# Patient Record
Sex: Male | Born: 1995 | Race: White | Hispanic: No | Marital: Single | Smoking: Never smoker
Health system: Southern US, Community
[De-identification: ages and names within clinical notes are randomized; demographics above are authoritative.]

## PROBLEM LIST (undated history)

## (undated) ENCOUNTER — Emergency Department (HOSPITAL_COMMUNITY): Admission: EM | Payer: Self-pay | Source: Home / Self Care

## (undated) DIAGNOSIS — R569 Unspecified convulsions: Secondary | ICD-10-CM

## (undated) DIAGNOSIS — S3992XA Unspecified injury of lower back, initial encounter: Secondary | ICD-10-CM

## (undated) HISTORY — PX: OTHER SURGICAL HISTORY: SHX169

## (undated) HISTORY — DX: Unspecified convulsions: R56.9

---
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
## 2018-01-27 ENCOUNTER — Emergency Department (HOSPITAL_COMMUNITY)
Admission: EM | Admit: 2018-01-27 | Discharge: 2018-01-27 | Disposition: A | Discharge status: Home / Self Care | Payer: Self-pay | Attending: Emergency Medicine | Admitting: Emergency Medicine

## 2018-01-27 ENCOUNTER — Encounter (HOSPITAL_COMMUNITY): Payer: Self-pay | Admitting: *Deleted

## 2018-01-27 DIAGNOSIS — R569 Unspecified convulsions: Secondary | ICD-10-CM

## 2018-01-27 DIAGNOSIS — Z79899 Other long term (current) drug therapy: Secondary | ICD-10-CM | POA: Insufficient documentation

## 2018-01-27 DIAGNOSIS — G40909 Epilepsy, unspecified, not intractable, without status epilepticus: Secondary | ICD-10-CM | POA: Insufficient documentation

## 2018-01-27 LAB — URINALYSIS, ROUTINE W REFLEX MICROSCOPIC
Bilirubin Urine: NEGATIVE
GLUCOSE, UA: NEGATIVE mg/dL
Hgb urine dipstick: NEGATIVE
Ketones, ur: NEGATIVE mg/dL
LEUKOCYTES UA: NEGATIVE
NITRITE: NEGATIVE
Protein, ur: NEGATIVE mg/dL
Specific Gravity, Urine: 1.021 (ref 1.005–1.030)
pH: 5 (ref 5.0–8.0)

## 2018-01-27 LAB — RAPID URINE DRUG SCREEN, HOSP PERFORMED
Amphetamines: NOT DETECTED
Barbiturates: NOT DETECTED
Benzodiazepines: NOT DETECTED
COCAINE: NOT DETECTED
OPIATES: NOT DETECTED
Tetrahydrocannabinol: POSITIVE — AB

## 2018-01-27 LAB — CBC WITH DIFFERENTIAL/PLATELET
Abs Immature Granulocytes: 0.07 10*3/uL (ref 0.00–0.07)
Basophils Absolute: 0.1 10*3/uL (ref 0.0–0.1)
Basophils Relative: 1 %
EOS ABS: 0.1 10*3/uL (ref 0.0–0.5)
EOS PCT: 1 %
HCT: 51.4 % (ref 39.0–52.0)
Hemoglobin: 16.3 g/dL (ref 13.0–17.0)
Immature Granulocytes: 1 %
Lymphocytes Relative: 33 %
Lymphs Abs: 2.3 10*3/uL (ref 0.7–4.0)
MCH: 28.9 pg (ref 26.0–34.0)
MCHC: 31.7 g/dL (ref 30.0–36.0)
MCV: 91.1 fL (ref 80.0–100.0)
Monocytes Absolute: 0.6 10*3/uL (ref 0.1–1.0)
Monocytes Relative: 8 %
NRBC: 0 % (ref 0.0–0.2)
Neutro Abs: 3.8 10*3/uL (ref 1.7–7.7)
Neutrophils Relative %: 56 %
Platelets: 239 10*3/uL (ref 150–400)
RBC: 5.64 MIL/uL (ref 4.22–5.81)
RDW: 11.8 % (ref 11.5–15.5)
WBC: 6.9 10*3/uL (ref 4.0–10.5)

## 2018-01-27 LAB — BASIC METABOLIC PANEL
Anion gap: 10 (ref 5–15)
BUN: 14 mg/dL (ref 6–20)
CALCIUM: 9.5 mg/dL (ref 8.9–10.3)
CO2: 24 mmol/L (ref 22–32)
Chloride: 106 mmol/L (ref 98–111)
Creatinine, Ser: 0.91 mg/dL (ref 0.61–1.24)
GFR calc non Af Amer: >60 mL/min (ref >60)
Glucose, Bld: 97 mg/dL (ref 70–99)
Potassium: 3.8 mmol/L (ref 3.5–5.1)
SODIUM: 140 mmol/L (ref 135–145)

## 2018-01-27 MED ORDER — ACETAMINOPHEN 325 MG PO TABS
650.0000 mg | ORAL_TABLET | Freq: Once | ORAL | Status: AC
  Administered 2018-01-27: 650 mg via ORAL
  Filled 2018-01-27: qty 2

## 2018-01-27 MED ORDER — LEVETIRACETAM 500 MG PO TABS: 500.0000 mg | ORAL_TABLET | Freq: Two times a day (BID) | ORAL | 2 refills | Status: DC

## 2018-01-27 MED ORDER — LEVETIRACETAM IN NACL 1000 MG/100ML IV SOLN
1000.0000 mg | Freq: Once | INTRAVENOUS | Status: AC
  Administered 2018-01-27: 1000 mg via INTRAVENOUS
  Filled 2018-01-27: qty 100

## 2018-01-27 MED ORDER — SODIUM CHLORIDE 0.9 % IV SOLN
INTRAVENOUS | Status: DC
  Administered 2018-01-27: 08:00:00 via INTRAVENOUS

## 2018-01-27 NOTE — ED Provider Notes (Addendum)
[facility] EMERGENCY DEPARTMENT Provider Note   CSN: [id number] Arrival date & time: 01/27/18  4098     History   Chief Complaint Chief Complaint  Patient presents with  . Seizures    HPI Patrick Mckay is a 22 y.o. male.  HPI   Patient transferred by EMS for evaluation of seizure.  He was at home with roommates, they were preparing breakfast, when roommates noticed that he was "choking."  After that they felt that he was unresponsive so called EMS.  Specific shaking was not described.  EMS felt that the patient was postictal, then recovered his mentation after short period of time.  Currently in the ED, patient denies headache, neck pain, back pain, nausea, vomiting, weakness or dizziness.  He is not taking antiepileptics although has been prescribed Keppra in the past.  He recalls taking it for only a few months.  There are no other known modifying factors.  Past Medical History:  Diagnosis Date  . Back injury    lower back, pedicle fracture 2011  . Seizures [facility])     Patient Active Problem List   Diagnosis Date Noted  . Seizures (HCC) 03/21/2014    Past Surgical History:  Procedure Laterality Date  . no surgical history          Home Medications    Prior to Admission medications   Medication Sig Start Date End Date Taking? Authorizing Provider  divalproex (DEPAKOTE ER) 500 MG 24 hr tablet TAKE 1 TABLET BY MOUTH AT NIGHT FOR 1 WEEK, THEN TAKE 2 TABLETS NIGHTLY THEREAFTER. NEED OFFICE VISIT 08/26/14   York Spaniel, MD  ibuprofen (ADVIL,MOTRIN) 200 MG tablet Take 600 mg by mouth every 6 (six) hours as needed for moderate pain.    [provider]  levETIRAcetam (KEPPRA) 500 MG tablet Take 1 tablet (500 mg total) by mouth 2 (two) times daily. 01/27/18   Mancel Bale, MD  oxyCODONE-acetaminophen (PERCOCET/ROXICET) 5-325 MG per tablet Take 1-2 tablets by mouth every 6 (six) hours as needed for moderate pain or severe pain. 04/08/14    Antony Madura, PA-C    Family History Family History  Problem Relation Age of Onset  . Seizures Other     Social History Social History   Tobacco Use  . Smoking status: Never Smoker  Substance Use Topics  . Alcohol use: No  . Drug use: Yes    Types: Marijuana     Allergies   Keppra [levetiracetam] and Tramadol   Review of Systems Review of Systems  All other systems reviewed and are negative.    Physical Exam Updated Vital Signs BP 117/77   Pulse 63   Temp 97.9 F (36.6 C) (Oral)   Resp 18   Ht 6\' 1"  (1.854 m)   Wt 77.1 kg   SpO2 98%   BMI 22.43 kg/m   Physical Exam  Constitutional: He is oriented to person, place, and time. He appears well-developed and well-nourished. No distress.  HENT:  Head: Normocephalic.  Right Ear: External ear normal.  Left Ear: External ear normal.  Abrasion left anterior tongue consistent with trauma during seizure.  Eyes: Pupils are equal, round, and reactive to light. Conjunctivae and EOM are normal.  Neck: Normal range of motion and phonation normal. Neck supple.  Cardiovascular: Normal rate.  Pulmonary/Chest: Effort normal. He exhibits no bony tenderness.  Musculoskeletal: Normal range of motion.  Neurological: He is alert and oriented to person, place, and time. No cranial nerve  deficit or sensory deficit. He exhibits normal muscle tone. Coordination normal.  Skin: Skin is warm, dry and intact.  Psychiatric: He has a normal mood and affect. His behavior is normal. Judgment and thought content normal.  Nursing note and vitals reviewed.    ED Treatments / Results  Labs (all labs ordered are listed, but only abnormal results are displayed) Labs Reviewed  CBC WITH DIFFERENTIAL/PLATELET  BASIC METABOLIC PANEL  URINALYSIS, ROUTINE W REFLEX MICROSCOPIC  RAPID URINE DRUG SCREEN, HOSP PERFORMED    EKG None  Radiology No results found.  Procedures .Critical Care Performed by: Mancel Bale, MD Authorized by:  Mancel Bale, MD   Critical care provider statement:    Critical care time (minutes):  35   Critical care start time:  01/27/2018 7:20 AM   Critical care end time:  01/27/2018 10:15 AM   Critical care time was exclusive of:  Separately billable procedures and treating other patients   Critical care was necessary to treat or prevent imminent or life-threatening deterioration of the following conditions:  CNS failure or compromise   Critical care was time spent personally by me on the following activities:  Blood draw for specimens, development of treatment plan with patient or surrogate, discussions with consultants, evaluation of patient's response to treatment, examination of patient, obtaining history from patient or surrogate, ordering and performing treatments and interventions, ordering and review of laboratory studies, pulse oximetry, re-evaluation of patient's condition, review of old charts and ordering and review of radiographic studies   (including critical care time)  Medications Ordered in ED Medications  0.9 %  sodium chloride infusion ( Intravenous New Bag/Given 01/27/18 0734)  acetaminophen (TYLENOL) tablet 650 mg (650 mg Oral Given 01/27/18 0827)  levETIRAcetam (KEPPRA) IVPB 1000 mg/100 mL premix (0 mg Intravenous Stopped 01/27/18 0957)     Initial Impression / Assessment and Plan / ED Course  I have reviewed the triage vital signs and the nursing notes.  Pertinent labs & imaging results that were available during my care of the patient were reviewed by me and considered in my medical decision making (see chart for details).  Clinical Course as of Jan 27 1014  Thu Jan 27, 2018  1610 Discussed case with paramedic who transferred the patient here.  Patient was postictal at the scene, regained consciousness and lucidity during transport.  He did not receive any treatment by EMS.   [EW]  [id number] Procedure and patient with known epilepsy not currently treated with antiepileptics.   Abnormal EEG in the past.  Will seek sources for lowering seizures threshold, observe and consider disposition at that time.   [EW]  [id number] He is comfortable.  I offered to start Keppra loading, he would prefer to wait and talk to his mother, he was previously unable to afford this medication, which is why he stopped it.  He reports smoking some marijuana but not using other drugs.  He now remembers that he was playing Xbox, when he had the seizure.   [EW]    Clinical Course User Index [EW] Mancel Bale, MD     Patient Vitals for the past 24 hrs:  BP Temp Temp src Pulse Resp SpO2 Height Weight  01/27/18 0800 117/77 - - 63 18 98 % - -  01/27/18 0730 117/76 - - 81 16 100 % - -  01/27/18 0726 122/72 97.9 F (36.6 C) Oral 74 20 100 % - -  01/27/18 0721 - - - - - - 6\' 1"  (1.854 m)  77.1 kg    10:13 AM Reevaluation with update and discussion. After initial assessment and treatment, an updated evaluation reveals Keppra infusion complete.  No seizure in ED.  Family and patient updated on findings and plan, all questions answered. Mancel Bale   Medical Decision Making: Recurrent seizure, with epilepsy, prior abnormal EEG.  No evidence for acute metabolic or infectious process.  Doubt significant musculoskeletal injury.  CRITICAL CARE-yes Performed by: Mancel Bale  Nursing Notes Reviewed/ Care Coordinated Applicable Imaging Reviewed Interpretation of Laboratory Data incorporated into ED treatment  The patient appears reasonably screened and/or stabilized for discharge and I doubt any other medical condition or other [facility] requiring further screening, evaluation, or treatment in the ED at this time prior to discharge.  Plan: Home Medications-OTC analgesia of choice; Home Treatments-avoid driving, swimming, bright lights; return here if the recommended treatment, does not improve the symptoms; Recommended follow up-neurology follow-up 1 or 2 weeks.    Final Clinical Impressions(s) / ED  Diagnoses   Final diagnoses:  Seizure [facility])    ED Discharge Orders         Ordered    levETIRAcetam (KEPPRA) 500 MG tablet  2 times daily     01/27/18 1010           Mancel Bale, MD 01/27/18 1015    Mancel Bale, MD 02/04/18 [phone]

## 2018-01-27 NOTE — Discharge Planning (Signed)
Marjarie Irion J. Lucretia Roers, RN, BSN, [place] [phone]  [facility] set up appointment with [facility] on 11/15 @0930 .  Spoke with pt at bedside and advised to please arrive 15 min early and take a picture ID and your current medications.  Pt verbalizes understanding of keeping appointment.

## 2018-01-27 NOTE — Discharge Instructions (Signed)
You appear to have had a seizure today.  We have started you on Keppra which you are previously prescribed.  A prescription was sent to your pharmacy, take to help prevent seizures.  Do not drive vehicles, or operate heavy equipment for at least 6 months or until released by a neurologist.  Also do not bathe alone, or go swimming.  Avoid bright lights, fatigue, and try to eat a good diet and drink plenty of fluids.  We sent a prescription to your pharmacy to continue treating you to prevent seizures.  Try to begin taking the medications, tonight or tomorrow.

## 2018-01-27 NOTE — ED Triage Notes (Signed)
Per EMS- pt has had seizure in the past with family history. Pt was cooking this morning and had a seizure.  Pt is also being evaluated for a thyroid issue. Pt was postictal. With EMS. Pt has hematomas to left eye. Denies neck or back pain. No oral trauma reported 124/82. HR 80. CBG 95. Pt is on no medication at this time for seizures

## 2018-02-04 ENCOUNTER — Inpatient Hospital Stay: Payer: Self-pay

## 2018-05-02 ENCOUNTER — Encounter: Payer: Self-pay | Admitting: *Deleted

## 2018-05-02 ENCOUNTER — Emergency Department (HOSPITAL_COMMUNITY)
Admission: EM | Admit: 2018-05-02 | Discharge: 2018-05-02 | Disposition: A | Discharge status: Home / Self Care | Payer: Self-pay | Attending: Emergency Medicine | Admitting: Emergency Medicine

## 2018-05-02 ENCOUNTER — Encounter (HOSPITAL_COMMUNITY): Payer: Self-pay

## 2018-05-02 ENCOUNTER — Other Ambulatory Visit: Payer: Self-pay

## 2018-05-02 DIAGNOSIS — Z760 Encounter for issue of repeat prescription: Secondary | ICD-10-CM | POA: Insufficient documentation

## 2018-05-02 DIAGNOSIS — Z79899 Other long term (current) drug therapy: Secondary | ICD-10-CM | POA: Insufficient documentation

## 2018-05-02 MED ORDER — LEVETIRACETAM 500 MG PO TABS: 500.0000 mg | ORAL_TABLET | Freq: Two times a day (BID) | ORAL | 0 refills | Status: AC

## 2018-05-02 NOTE — Progress Notes (Signed)
[facility] consulted regarding medication assistance and PCP establishment.  EDCM reviewed chart to find that this [facility] secured an appointment at [facility] in November for pt and pt was no call/no-show.  Pt will have to call for appointment as office will only schedule if pt calls.

## 2018-05-02 NOTE — Discharge Instructions (Signed)
Call the office listed above to make an appointment for follow up and to establish care with a primary care doctor.  Please return to the emergency department for any new or worsening symptoms.

## 2018-05-02 NOTE — Progress Notes (Signed)
CSW acknowledges consult for medication needs. CSW will updated RNCM as RNCM can better assists with getting medication needs met in the event that pt qualifies.     At this time CSW will sign off as there re no further CSW need warranted.     Virgie Dad Sahasra Belue, MSW, [place] [facility] Clinical Social Worker [phone]

## 2018-05-02 NOTE — ED Triage Notes (Signed)
Pt reports that he needs a Keppra refill. No complaints.

## 2018-05-02 NOTE — ED Notes (Signed)
Bed: WTR6 Expected date:  Expected time:  Means of arrival:  Comments: 

## 2018-05-02 NOTE — ED Provider Notes (Signed)
[facility]-EMERGENCY DEPT Provider Note   CSN: [id number] Arrival date & time: 05/02/18  0606     History   Chief Complaint Chief Complaint  Patient presents with  . Medication Refill    HPI Patrick Mckay is a 23 y.o. male.  HPI  23 year old male with a history of seizures presenting emergency department today for for medication refill.  States he ran out of his Keppra.  Takes Keppra 500 twice daily.  He states he has not had any breakthrough seizures since taking this medication.  He has no complaints at this time.  He has not followed up with the PCP because he does not have insurance.  Past Medical History:  Diagnosis Date  . Back injury    lower back, pedicle fracture 2011  . Seizures [facility])     Patient Active Problem List   Diagnosis Date Noted  . Seizures (HCC) 03/21/2014    Past Surgical History:  Procedure Laterality Date  . no surgical history          Home Medications    Prior to Admission medications   Medication Sig Start Date End Date Taking? Authorizing Provider  divalproex (DEPAKOTE ER) 500 MG 24 hr tablet TAKE 1 TABLET BY MOUTH AT NIGHT FOR 1 WEEK, THEN TAKE 2 TABLETS NIGHTLY THEREAFTER. NEED OFFICE VISIT 08/26/14   York Spaniel, MD  ibuprofen (ADVIL,MOTRIN) 200 MG tablet Take 600 mg by mouth every 6 (six) hours as needed for moderate pain.    [provider]  levETIRAcetam (KEPPRA) 500 MG tablet Take 1 tablet (500 mg total) by mouth 2 (two) times daily for 30 days. 05/02/18 06/01/18  Ansel Ferrall S, PA-C  oxyCODONE-acetaminophen (PERCOCET/ROXICET) 5-325 MG per tablet Take 1-2 tablets by mouth every 6 (six) hours as needed for moderate pain or severe pain. 04/08/14   Antony Madura, PA-C    Family History Family History  Problem Relation Age of Onset  . Seizures Other     Social History Social History   Tobacco Use  . Smoking status: Never Smoker  Substance Use Topics  . Alcohol use: No  . Drug use: Yes      Types: Marijuana     Allergies   Keppra [levetiracetam] and Tramadol   Review of Systems Review of Systems  Constitutional: Negative for fever.  Respiratory: Negative for shortness of breath.   Cardiovascular: Negative for chest pain.  Gastrointestinal: Negative for abdominal pain and diarrhea.  Musculoskeletal: Negative for back pain.  Neurological: Negative for seizures.     Physical Exam Updated Vital Signs BP (!) 149/97 (BP Location: Right Arm)   Pulse 72   Temp 97.7 F (36.5 C) (Oral)   Resp 17   SpO2 99%   Physical Exam Constitutional:      General: He is not in acute distress.    Appearance: He is well-developed.  HENT:     Head: Normocephalic and atraumatic.  Eyes:     Conjunctiva/sclera: Conjunctivae normal.  Cardiovascular:     Rate and Rhythm: Normal rate.  Pulmonary:     Effort: Pulmonary effort is normal.  Musculoskeletal: Normal range of motion.  Skin:    General: Skin is warm and dry.  Neurological:     Mental Status: He is alert and oriented to person, place, and time.     Coordination: Coordination normal.  Psychiatric:        Mood and Affect: Mood normal.    ED Treatments / Results  Labs (all labs ordered are listed, but only abnormal results are displayed) Labs Reviewed - No data to display  EKG None  Radiology No results found.  Procedures Procedures (including critical care time)  Medications Ordered in ED Medications - No data to display   Initial Impression / Assessment and Plan / ED Course  I have reviewed the triage vital signs and the nursing notes.  Pertinent labs & imaging results that were available during my care of the patient were reviewed by me and considered in my medical decision making (see chart for details).     Final Clinical Impressions(s) / ED Diagnoses   Final diagnoses:  Medication refill   Patient presenting for medication refill.  He takes Keppra 500 mg twice daily.  He has had no  breakthrough seizures.  He has no complaints today.  Will refill Rx.  Advised importance of follow-up with PCP and that he needs to establish care.  Social work consult placed.  Information for PCP's given to the patient may follow-up.  He was advised to return the ER for new or worsening symptoms.  He voiced understanding the plan reasons to return.  All questions answered.  ED Discharge Orders         Ordered    levETIRAcetam (KEPPRA) 500 MG tablet  2 times daily     05/02/18 0701           Karrie Meres, PA-C 05/02/18 1539    Derwood Kaplan, MD 05/03/18 [phone]

## 2020-06-11 ENCOUNTER — Emergency Department (HOSPITAL_COMMUNITY): Payer: Self-pay

## 2020-06-11 ENCOUNTER — Emergency Department (HOSPITAL_COMMUNITY)
Admission: EM | Admit: 2020-06-11 | Discharge: 2020-06-11 | Disposition: A | Discharge status: Home / Self Care | Payer: Self-pay | Attending: Emergency Medicine | Admitting: Emergency Medicine

## 2020-06-11 ENCOUNTER — Encounter (HOSPITAL_COMMUNITY): Payer: Self-pay

## 2020-06-11 ENCOUNTER — Other Ambulatory Visit: Payer: Self-pay

## 2020-06-11 DIAGNOSIS — R0789 Other chest pain: Secondary | ICD-10-CM | POA: Insufficient documentation

## 2020-06-11 DIAGNOSIS — Y9375 Activity, martial arts: Secondary | ICD-10-CM | POA: Insufficient documentation

## 2020-06-11 DIAGNOSIS — W500XXA Accidental hit or strike by another person, initial encounter: Secondary | ICD-10-CM | POA: Insufficient documentation

## 2020-06-11 MED ORDER — KETOROLAC TROMETHAMINE 60 MG/2ML IM SOLN
60.0000 mg | Freq: Once | INTRAMUSCULAR | Status: AC
  Administered 2020-06-11: 60 mg via INTRAMUSCULAR
  Filled 2020-06-11: qty 2

## 2020-06-11 MED ORDER — CETIRIZINE HCL 10 MG PO TABS: 10.0000 mg | ORAL_TABLET | Freq: Every day | ORAL | 0 refills | Status: AC

## 2020-06-11 MED ORDER — IBUPROFEN 600 MG PO TABS: 600.0000 mg | ORAL_TABLET | Freq: Four times a day (QID) | ORAL | 0 refills | Status: AC | PRN

## 2020-06-11 MED ORDER — FLUTICASONE PROPIONATE 50 MCG/ACT NA SUSP: 1.0000 | Freq: Every day | NASAL | 2 refills | Status: AC

## 2020-06-11 NOTE — ED Triage Notes (Signed)
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
## 2020-06-11 NOTE — Discharge Instructions (Addendum)
Please read the attachment on rib fractures.  While your x-ray obtained here in the ED was unremarkable and without any obvious fracture, these images have low sensitivity for detecting cracked ribs.    Please avoid any further MMA or contact sports until your symptoms improve.  Given your recent allergic rhinitis and seasonal allergy symptoms that have been exacerbating her pain symptoms, I encourage you to start taking cetirizine 10 mg daily and Flonase intranasally for the next 14 days.  As for your pain and discomfort, please modify activities as needed and take ibuprofen 600 mg every 6 hours as needed for pain control.  You may supplement with Tylenol as well as topical over-the-counter pain control agents (lotions, gels).    Follow up with your primary care provider regarding today's encounter.  Return to the ED or seek immediate medical attention should you experience any new or worsening symptoms.

## 2020-12-30 ENCOUNTER — Other Ambulatory Visit: Payer: Self-pay

## 2020-12-30 ENCOUNTER — Emergency Department (HOSPITAL_COMMUNITY)
Admission: EM | Admit: 2020-12-30 | Discharge: 2020-12-30 | Disposition: A | Discharge status: Home / Self Care | Payer: Self-pay | Attending: Emergency Medicine | Admitting: Emergency Medicine

## 2020-12-30 ENCOUNTER — Encounter (HOSPITAL_COMMUNITY): Payer: Self-pay

## 2020-12-30 ENCOUNTER — Emergency Department (HOSPITAL_COMMUNITY): Payer: Self-pay

## 2020-12-30 DIAGNOSIS — J039 Acute tonsillitis, unspecified: Secondary | ICD-10-CM | POA: Insufficient documentation

## 2020-12-30 LAB — CBC WITH DIFFERENTIAL/PLATELET
Abs Immature Granulocytes: 0.02 10*3/uL (ref 0.00–0.07)
Basophils Absolute: 0 10*3/uL (ref 0.0–0.1)
Basophils Relative: 1 %
Eosinophils Absolute: 0.1 10*3/uL (ref 0.0–0.5)
Eosinophils Relative: 2 %
HCT: 42.9 % (ref 39.0–52.0)
Hemoglobin: 14.5 g/dL (ref 13.0–17.0)
Immature Granulocytes: 0 %
Lymphocytes Relative: 19 %
Lymphs Abs: 1.6 10*3/uL (ref 0.7–4.0)
MCH: 30.3 pg (ref 26.0–34.0)
MCHC: 33.8 g/dL (ref 30.0–36.0)
MCV: 89.7 fL (ref 80.0–100.0)
Monocytes Absolute: 1.3 10*3/uL — ABNORMAL HIGH (ref 0.1–1.0)
Monocytes Relative: 15 %
Neutro Abs: 5.4 10*3/uL (ref 1.7–7.7)
Neutrophils Relative %: 63 %
Platelets: 170 10*3/uL (ref 150–400)
RBC: 4.78 MIL/uL (ref 4.22–5.81)
RDW: 11.6 % (ref 11.5–15.5)
WBC: 8.5 10*3/uL (ref 4.0–10.5)
nRBC: 0 % (ref 0.0–0.2)

## 2020-12-30 LAB — BASIC METABOLIC PANEL
Anion gap: 6 (ref 5–15)
BUN: 14 mg/dL (ref 6–20)
CO2: 30 mmol/L (ref 22–32)
Calcium: 9.2 mg/dL (ref 8.9–10.3)
Chloride: 106 mmol/L (ref 98–111)
Creatinine, Ser: 0.61 mg/dL (ref 0.61–1.24)
GFR, Estimated: >60 mL/min (ref >60)
Glucose, Bld: 92 mg/dL (ref 70–99)
Potassium: 4.1 mmol/L (ref 3.5–5.1)
Sodium: 142 mmol/L (ref 135–145)

## 2020-12-30 LAB — GROUP A STREP BY PCR: Group A Strep by PCR: NOT DETECTED

## 2020-12-30 LAB — MONONUCLEOSIS SCREEN: Mono Screen: NEGATIVE

## 2020-12-30 MED ORDER — DEXAMETHASONE SODIUM PHOSPHATE 10 MG/ML IJ SOLN
10.0000 mg | Freq: Once | INTRAMUSCULAR | Status: AC
  Administered 2020-12-30: 10 mg via INTRAMUSCULAR
  Filled 2020-12-30: qty 1

## 2020-12-30 MED ORDER — IOHEXOL 350 MG/ML SOLN
75.0000 mL | Freq: Once | INTRAVENOUS | Status: AC | PRN
  Administered 2020-12-30: 60 mL via INTRAVENOUS

## 2020-12-30 MED ORDER — HYDROCODONE-ACETAMINOPHEN 5-325 MG PO TABS: 1.0000 | ORAL_TABLET | Freq: Four times a day (QID) | ORAL | 0 refills | Status: AC | PRN

## 2020-12-30 MED ORDER — AMOXICILLIN 500 MG PO CAPS: 500.0000 mg | ORAL_CAPSULE | Freq: Two times a day (BID) | ORAL | 0 refills | Status: AC

## 2020-12-30 MED ORDER — KETOROLAC TROMETHAMINE 60 MG/2ML IM SOLN
30.0000 mg | Freq: Once | INTRAMUSCULAR | Status: AC
  Administered 2020-12-30: 30 mg via INTRAMUSCULAR
  Filled 2020-12-30: qty 2

## 2020-12-30 NOTE — ED Notes (Signed)
Patient transported to CT 

## 2020-12-30 NOTE — ED Provider Notes (Signed)
[facility] Provider Note   CSN: [id number] Arrival date & time: 12/30/20  0600     History Chief Complaint  Patient presents with   Sore Throat    BRAIN HONEYCUTT is a 25 y.o. male.  With no significant past medical history presents emergency department with sore throat.  States that he has had sore throat for 1 week, primarily on the right.  States that it then progressed 2 days ago to increased swelling of his right tonsil and neck as well as halitosis.  He states he typically has good dental hygiene and has been using mouthwash for the past few days which has not improved his symptoms.  He states that he asked his mom to look in the back of the throat and she noted tonsillar swelling and exudates.  He denies fevers, difficulty swallowing or drooling, rashes, joint swelling or pain, recent dental procedures, recent infections.  He does state that he had oral sex about 3 weeks ago with a male partner.  States that it did cross his mind if he had an STD.  States that he contacted the male who went last night to be checked for STDs, results pending.   Sore Throat Pertinent negatives include no shortness of breath.      Past Medical History:  Diagnosis Date   Back injury    lower back, pedicle fracture 2011   Seizures [facility])     Patient Active Problem List   Diagnosis Date Noted   Seizures (HCC) 03/21/2014    Past Surgical History:  Procedure Laterality Date   no surgical history         Family History  Problem Relation Age of Onset   Healthy Mother    Healthy Father    Seizures Other     Social History   Tobacco Use   Smoking status: Never   Smokeless tobacco: Never  Vaping Use   Vaping Use: Never used  Substance Use Topics   Alcohol use: No   Drug use: Yes    Types: Marijuana    Home Medications Prior to Admission medications   Medication Sig Start Date End Date Taking? Authorizing Provider  cetirizine  (ZYRTEC) 10 MG tablet Take 1 tablet (10 mg total) by mouth daily. 06/11/20   Lorelee New, PA-C  divalproex (DEPAKOTE ER) 500 MG 24 hr tablet TAKE 1 TABLET BY MOUTH AT NIGHT FOR 1 WEEK, THEN TAKE 2 TABLETS NIGHTLY THEREAFTER. NEED OFFICE VISIT 08/26/14   York Spaniel, MD  fluticasone [facility]) 50 MCG/ACT nasal spray Place 1 spray into both nostrils daily. 06/11/20   Lorelee New, PA-C  ibuprofen (ADVIL) 600 MG tablet Take 1 tablet (600 mg total) by mouth every 6 (six) hours as needed. 06/11/20   Lorelee New, PA-C  levETIRAcetam (KEPPRA) 500 MG tablet Take 1 tablet (500 mg total) by mouth 2 (two) times daily for 30 days. 05/02/18 06/01/18  Couture, Cortni S, PA-C  oxyCODONE-acetaminophen (PERCOCET/ROXICET) 5-325 MG per tablet Take 1-2 tablets by mouth every 6 (six) hours as needed for moderate pain or severe pain. 04/08/14   Antony Madura, PA-C    Allergies    Keppra [levetiracetam] and Tramadol  Review of Systems   Review of Systems  Constitutional:  Negative for fever.  HENT:  Positive for ear pain and sore throat. Negative for dental problem and drooling.   Respiratory:  Negative for shortness of breath.   Hematological:  Positive for adenopathy.  All other systems reviewed and are negative.  Physical Exam Updated Vital Signs BP 118/89   Pulse 73   Temp 98.6 F (37 C) (Oral)   Resp 16   SpO2 99%   Physical Exam Vitals and nursing note reviewed.  Constitutional:      Appearance: He is well-developed. He is not toxic-appearing.  HENT:     Head: Normocephalic and atraumatic.     Right Ear: Tympanic membrane normal.     Left Ear: Tympanic membrane normal.     Nose: No congestion.     Mouth/Throat:     Mouth: Mucous membranes are moist. No oral lesions.     Tongue: No lesions.     Palate: No lesions.     Pharynx: Posterior oropharyngeal erythema present. No uvula swelling.     Tonsils: Tonsillar exudate present. 2+ on the right. 0 on the left.     Comments: Brown  discoloration of the right tonsil.  Cervical adenopathy on the right that is obvious without palpation. Eyes:     General: No scleral icterus.    Conjunctiva/sclera: Conjunctivae normal.     Pupils: Pupils are equal, round, and reactive to light.  Cardiovascular:     Rate and Rhythm: Normal rate and regular rhythm.     Heart sounds: Normal heart sounds. No murmur heard. Pulmonary:     Effort: Pulmonary effort is normal. No respiratory distress.  Abdominal:     Palpations: Abdomen is soft.  Musculoskeletal:     Cervical back: Normal range of motion and neck supple.  Lymphadenopathy:     Cervical: Cervical adenopathy present.     Right cervical: Superficial cervical adenopathy present. No posterior cervical adenopathy. Skin:    General: Skin is warm and dry.     Capillary Refill: Capillary refill takes less than 2 seconds.     Findings: No rash.  Neurological:     General: No focal deficit present.     Mental Status: He is alert and oriented to person, place, and time.  Psychiatric:        Mood and Affect: Mood normal.        Behavior: Behavior normal.        Thought Content: Thought content normal.        Judgment: Judgment normal.    ED Results / Procedures / Treatments   Labs (all labs ordered are listed, but only abnormal results are displayed) Labs Reviewed  GROUP A STREP BY PCR  MONONUCLEOSIS SCREEN  GC/CHLAMYDIA PROBE AMP ([place]) NOT AT [facility]   EKG None  Radiology No results found.  Procedures Procedures   Medications Ordered in ED Medications - No data to display  ED Course  I have reviewed the triage vital signs and the nursing notes.  Pertinent labs & imaging results that were available during my care of the patient were reviewed by me and considered in my medical decision making (see chart for details).    MDM Rules/Calculators/A&P 25 year old male presents emergency department with sore throat.  Right tonsil 2+, left 0, mild erythema and  throat cervical adenopathy on the right. Centor score: 3.  Strep negative. EBV pending, gonorrhea pending.  At time of handoff to Dr. Bernette Mayers, EBV pending.  Plan to give IM Decadron and possible course of antibiotics depending on EBV results.  Patient is stable at time of hand off   Final Clinical Impression(s) / ED Diagnoses Final diagnoses:  None    Rx / DC Orders ED  Discharge Orders     None        Lenard Galloway 12/30/20 1062    Pollyann Savoy, MD 12/30/20 [phone]

## 2020-12-30 NOTE — ED Triage Notes (Signed)
Pt complains of sore throat x 2 days. Pt reports seeing white patches on his tonsils.

## 2020-12-30 NOTE — ED Provider Notes (Signed)
Patient seen and examined, R sided tonsil swelling with deviated uvula and large cervical lymph nodes. Strep and mono are neg but given degree of swelling concern for PTA. Will send labs and check CT. Patient given decadron and toradol for comfort.   Clinical Course as of 12/30/20 1422  Mon Dec 30, 2020  1247 CBC is normal.  [CS]  1247 Patient upset at the long wait time for his labs and CT to be done, states he wants to leave. I was able to convince him to stay to complete his workup.  [CS]  1318 BMP is normal.  [CS]  1422 CT shows swollen tonsil, no abscess. Rx for Amoxil, pain meds, ENT followup [CS]    Clinical Course User Index [CS] Pollyann Savoy, MD      Pollyann Savoy, MD 12/30/20 [phone]

## 2020-12-31 LAB — GC/CHLAMYDIA PROBE AMP (~~LOC~~) NOT AT ARMC
Chlamydia: NEGATIVE
Comment: NEGATIVE
Comment: NORMAL
Neisseria Gonorrhea: NEGATIVE

## 2021-12-17 ENCOUNTER — Emergency Department (HOSPITAL_COMMUNITY)
Admission: EM | Admit: 2021-12-17 | Discharge: 2021-12-17 | Disposition: A | Discharge status: Home / Self Care | Payer: Self-pay | Attending: Emergency Medicine | Admitting: Emergency Medicine

## 2021-12-17 ENCOUNTER — Encounter (HOSPITAL_COMMUNITY): Payer: Self-pay | Admitting: *Deleted

## 2021-12-17 DIAGNOSIS — U071 COVID-19: Secondary | ICD-10-CM | POA: Insufficient documentation

## 2021-12-17 DIAGNOSIS — R52 Pain, unspecified: Secondary | ICD-10-CM

## 2021-12-17 LAB — RESP PANEL BY RT-PCR (FLU A&B, COVID) ARPGX2
Influenza A by PCR: NEGATIVE
Influenza B by PCR: NEGATIVE
SARS Coronavirus 2 by RT PCR: POSITIVE — AB

## 2021-12-17 NOTE — Discharge Instructions (Signed)
Return for difficulty breathing or new concerns. Take Tylenol every 4 hours 1000 mg or ibuprofen/Motrin every 6 hours for body aches and fevers.

## 2021-12-17 NOTE — ED Provider Triage Note (Signed)
Emergency Medicine Provider Triage Evaluation Note  Patrick Mckay , a 26 y.o. male  was evaluated in triage.  Pt complains of body aches for the past several days.  Temp at home around 100F, has been taking motrin.  Last dose about 1 hour PTA.  No sick contacts.  Never had covid vaccines.  Review of Systems  Positive: Body aches, fever Negative: Cough, SOB  Physical Exam  BP (!) 154/94 (BP Location: Right Arm)   Pulse 98   Temp 99.4 F (37.4 C) (Oral)   Resp 18   SpO2 98%  Gen:   Awake, no distress   Resp:  Normal effort  MSK:   Moves extremities without difficulty  Other:  Clammy in appearance  Medical Decision Making  Medically screening exam initiated at 6:08 AM.  Appropriate orders placed.  Patrick Mckay was informed that the remainder of the evaluation will be completed by another provider, this initial triage assessment does not replace that evaluation, and the importance of remaining in the ED until their evaluation is complete.  Body aches.  Clammy in appearance.  VSS.  Covid/flu screen sent.   Larene Pickett, PA-C 12/17/21 [phone]

## 2021-12-17 NOTE — ED Provider Notes (Signed)
[facility] EMERGENCY DEPARTMENT Provider Note   CSN: [id number] Arrival date & time: 12/17/21  0600     History  Chief Complaint  Patient presents with   Generalized Body Aches    Patrick Mckay is a 26 y.o. male.  Patient presents with body aches, feeling generally unwell with temperature around 100 degrees.  Patient tried Tylenol and Motrin with minimal relief.  No sick contacts, no history of having COVID-vaccine.  Mild congestion without significant cough, no shortness of breath or chest pain.       Home Medications Prior to Admission medications   Medication Sig Start Date End Date Taking? Authorizing Provider  amoxicillin (AMOXIL) 500 MG capsule Take 1 capsule (500 mg total) by mouth 2 (two) times daily. 12/30/20   Pollyann Savoy, MD  cetirizine (ZYRTEC) 10 MG tablet Take 1 tablet (10 mg total) by mouth daily. 06/11/20   Lorelee New, PA-C  divalproex (DEPAKOTE ER) 500 MG 24 hr tablet TAKE 1 TABLET BY MOUTH AT NIGHT FOR 1 WEEK, THEN TAKE 2 TABLETS NIGHTLY THEREAFTER. NEED OFFICE VISIT 08/26/14   York Spaniel, MD  fluticasone [facility]) 50 MCG/ACT nasal spray Place 1 spray into both nostrils daily. 06/11/20   Lorelee New, PA-C  HYDROcodone-acetaminophen (NORCO/VICODIN) 5-325 MG tablet Take 1 tablet by mouth every 6 (six) hours as needed for severe pain. 12/30/20   Pollyann Savoy, MD  ibuprofen (ADVIL) 600 MG tablet Take 1 tablet (600 mg total) by mouth every 6 (six) hours as needed. 06/11/20   Lorelee New, PA-C  levETIRAcetam (KEPPRA) 500 MG tablet Take 1 tablet (500 mg total) by mouth 2 (two) times daily for 30 days. 05/02/18 06/01/18  Couture, Cortni S, PA-C      Allergies    Keppra [levetiracetam] and Tramadol    Review of Systems   Review of Systems  Constitutional:  Positive for fatigue. Negative for chills and fever.  HENT:  Positive for congestion.   Eyes:  Negative for visual disturbance.  Respiratory:  Negative for  shortness of breath.   Cardiovascular:  Negative for chest pain.  Gastrointestinal:  Negative for abdominal pain and vomiting.  Genitourinary:  Negative for dysuria and flank pain.  Musculoskeletal:  Positive for arthralgias. Negative for back pain, neck pain and neck stiffness.  Skin:  Negative for rash.  Neurological:  Negative for light-headedness and headaches.    Physical Exam Updated Vital Signs BP (!) 142/86 (BP Location: Left Arm)   Pulse 91   Temp 98.7 F (37.1 C) (Oral)   Resp 20   SpO2 100%  Physical Exam Vitals and nursing note reviewed.  Constitutional:      General: He is not in acute distress.    Appearance: He is well-developed.  HENT:     Head: Normocephalic and atraumatic.     Mouth/Throat:     Mouth: Mucous membranes are moist.  Eyes:     General:        Right eye: No discharge.        Left eye: No discharge.     Conjunctiva/sclera: Conjunctivae normal.  Neck:     Trachea: No tracheal deviation.  Cardiovascular:     Rate and Rhythm: Normal rate and regular rhythm.     Heart sounds: No murmur heard. Pulmonary:     Effort: Pulmonary effort is normal.     Breath sounds: Normal breath sounds.  Abdominal:     General: There is no distension.  Palpations: Abdomen is soft.     Tenderness: There is no abdominal tenderness. There is no guarding.  Musculoskeletal:        General: Normal range of motion.     Cervical back: Normal range of motion and neck supple. No rigidity or tenderness.  Skin:    General: Skin is warm.     Capillary Refill: Capillary refill takes less than 2 seconds.     Findings: No rash.  Neurological:     General: No focal deficit present.     Mental Status: He is alert.  Psychiatric:        Mood and Affect: Mood normal.     ED Results / Procedures / Treatments   Labs (all labs ordered are listed, but only abnormal results are displayed) Labs Reviewed  RESP PANEL BY RT-PCR (FLU A&B, COVID) ARPGX2 - Abnormal; Notable for  the following components:      Result Value   SARS Coronavirus 2 by RT PCR POSITIVE (*)    All other components within normal limits    EKG None  Radiology No results found.  Procedures Procedures    Medications Ordered in ED Medications - No data to display  ED Course/ Medical Decision Making/ A&P                           Medical Decision Making  Patient presents with clinical concern for viral syndrome with body aches and mild congestion low-grade fever.  Patient has normal work of breathing, normal oxygenation, no signs of serious bacterial infection such as meningitis.  Blood work not indicated at this time.  COVID test returned positive, influenza test negative.  Updated patient on plan of care and reasons to return.        Final Clinical Impression(s) / ED Diagnoses Final diagnoses:  COVID-19  Body aches    Rx / DC Orders ED Discharge Orders     None         Elnora Morrison, MD 12/17/21 [phone]

## 2021-12-17 NOTE — ED Notes (Signed)
Chart stated iv in left antecubital and pt has never had iv placed

## 2021-12-17 NOTE — ED Triage Notes (Signed)
Body aches, temp 100 at home, Last ibuprofen about 1 hour ago. Denies cough or sore throat.

## 2023-06-05 IMAGING — CT CT NECK W/ CM
4 series · 15 of 33 positions shown, 18 images · IV contrast (OMNIPAQUE 350)
Comparison: Head CT 03/12/2014.

CLINICAL DATA: Epiglottitis or tonsillitis suspected. Additional
history provided: Patient reports sore throat for 2 days, difficulty
swallowing.

EXAM:
CT NECK WITH CONTRAST
TECHNIQUE: Multidetector CT imaging of the neck was performed using the
standard protocol following the bolus administration of intravenous
contrast.
CONTRAST:  60mL OMNIPAQUE IOHEXOL 350 MG/ML SOLN

[Series 2: axial neck · axial · 0.53mm/px · z∈[-286,-106]mm · 5 of 136 slices shown, 7 images]
[im 23/136  soft-tissue]
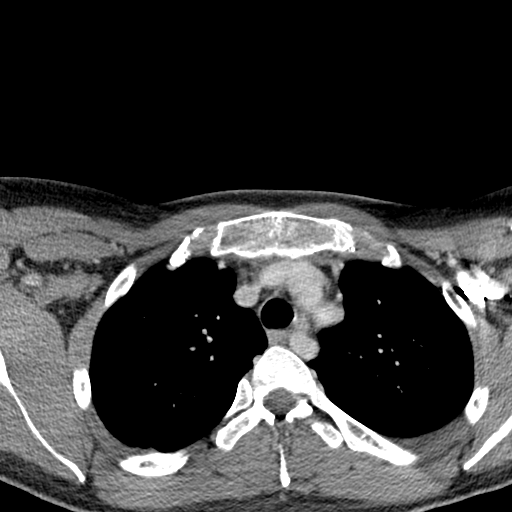
[im 23/136  bone]
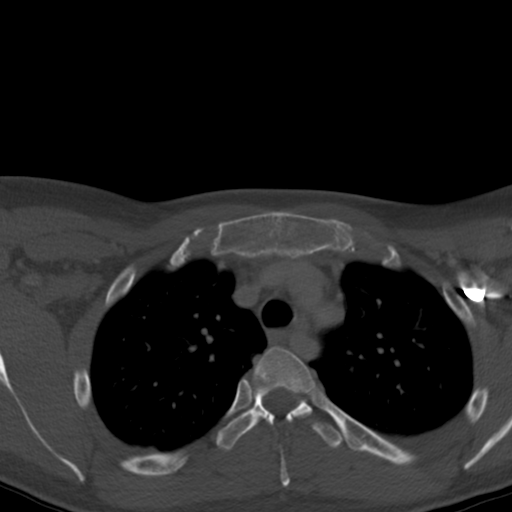
[im 46/136  bone]
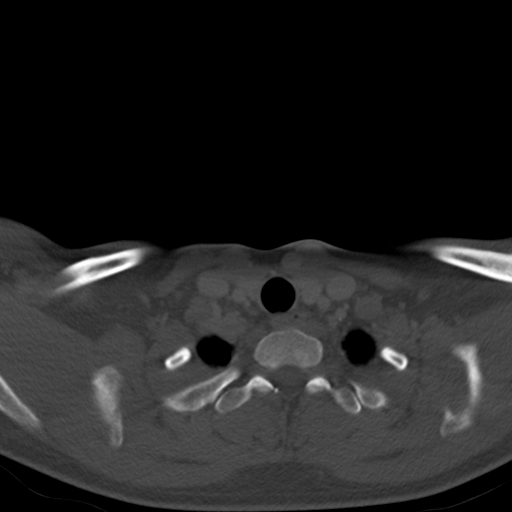
[im 68/136  bone]
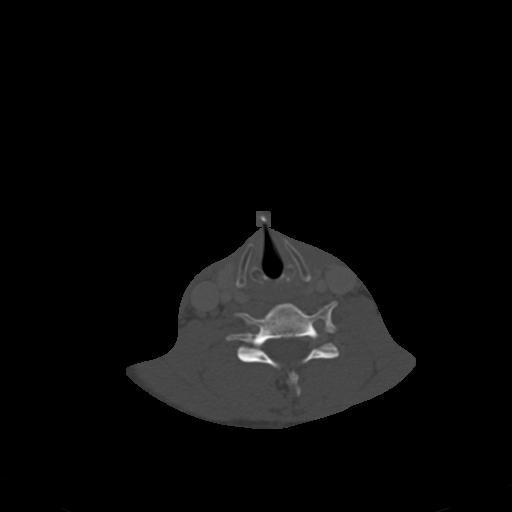
[im 91/136  bone]
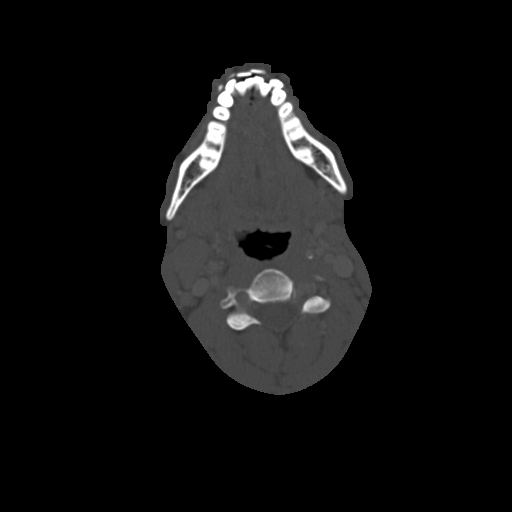
[im 113/136  soft-tissue]
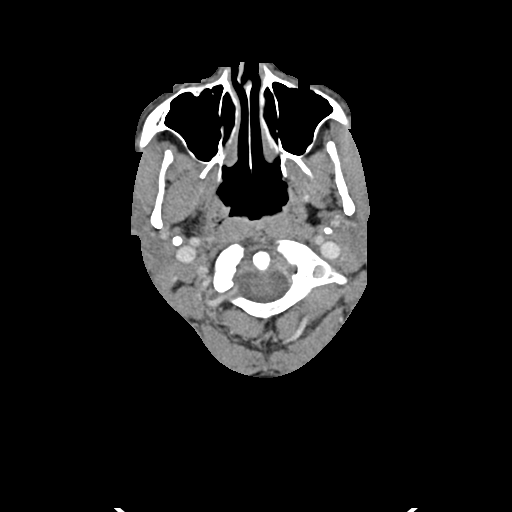
[im 113/136  bone]
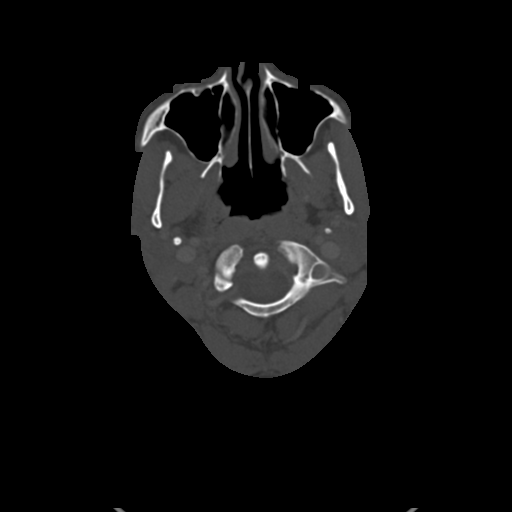

[Series 5: axial · axial · 0.38mm/px · z∈[-290,-244]mm · 2 of 138 slices shown]
[im 23/138  bone]
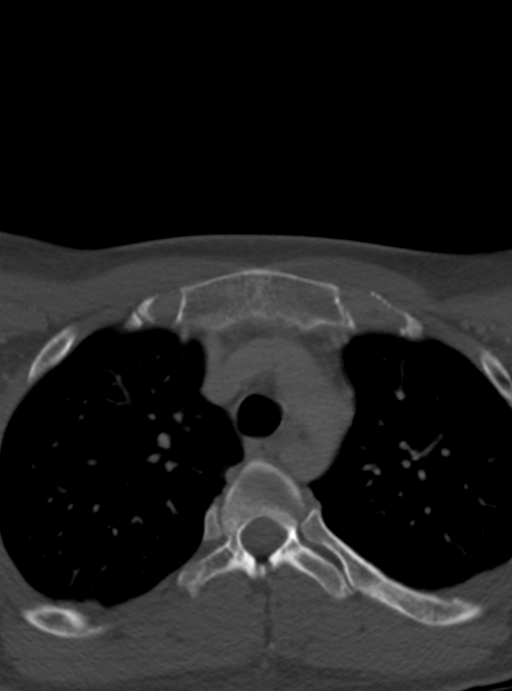
[im 46/138  bone]
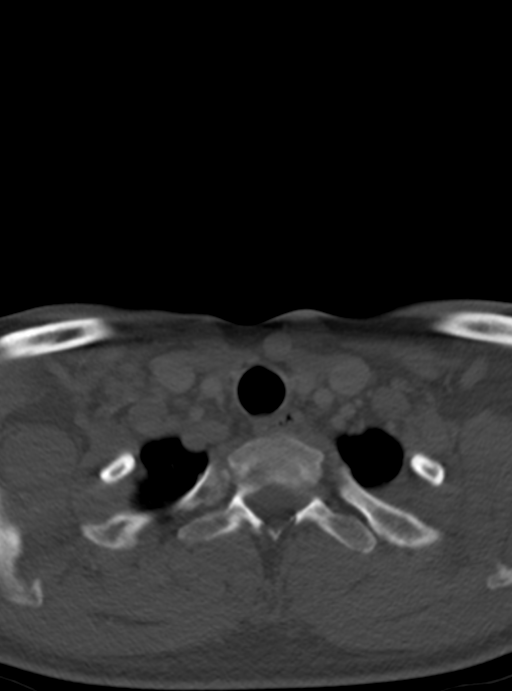

[Series 6: coronal · coronal · 0.54mm/px · 3 of 120 slices shown]
[im 24/120  bone]
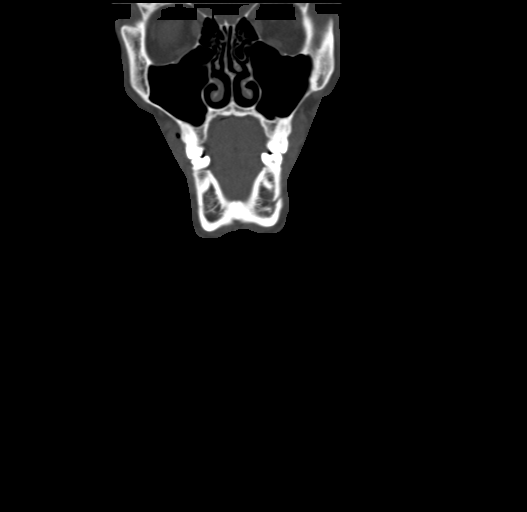
[im 48/120  bone]
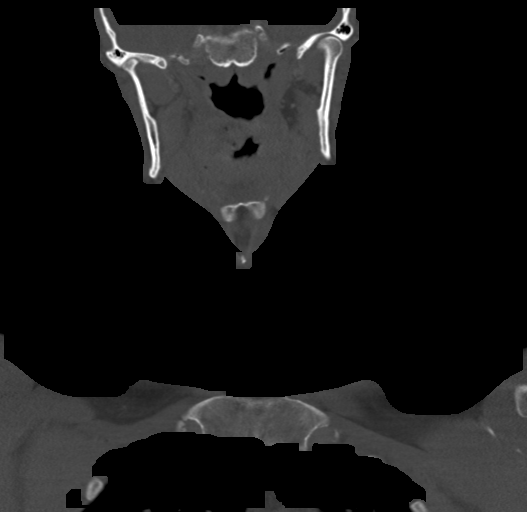
[im 72/120  bone]
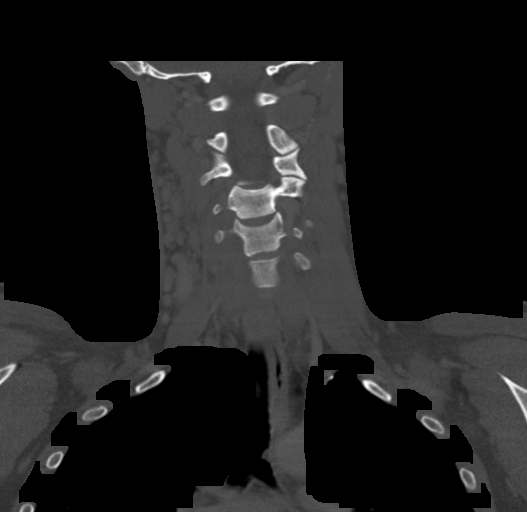

[Series 7: sagittal · sagittal · 0.54mm/px · 5 of 101 slices shown, 6 images]
[im 34/101  bone]
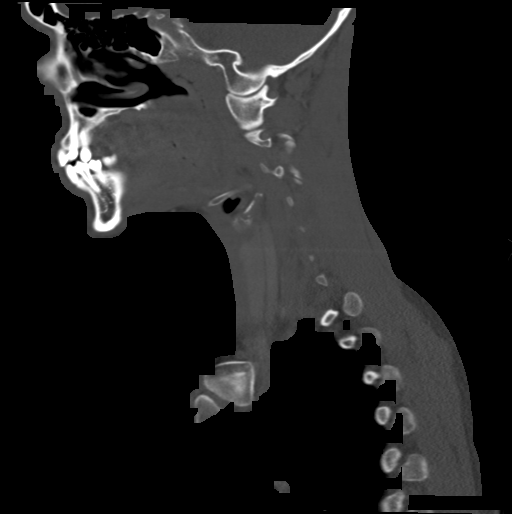
[im 42/101  bone]
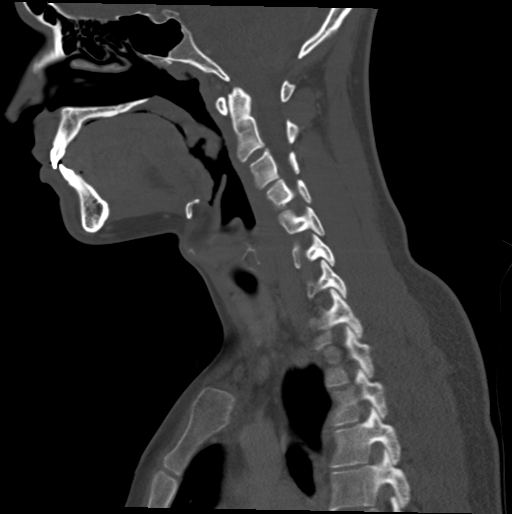
[im 51/101  soft-tissue]
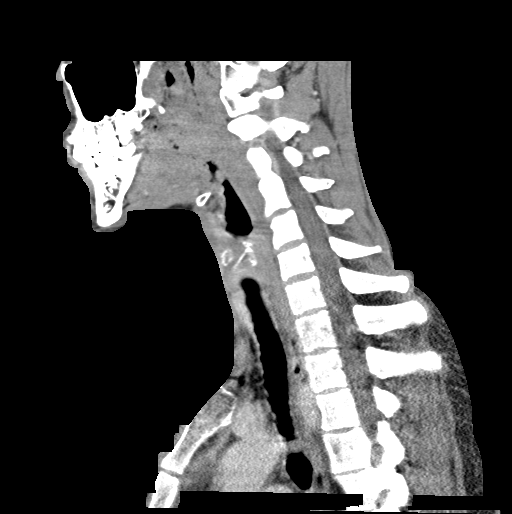
[im 51/101  bone]
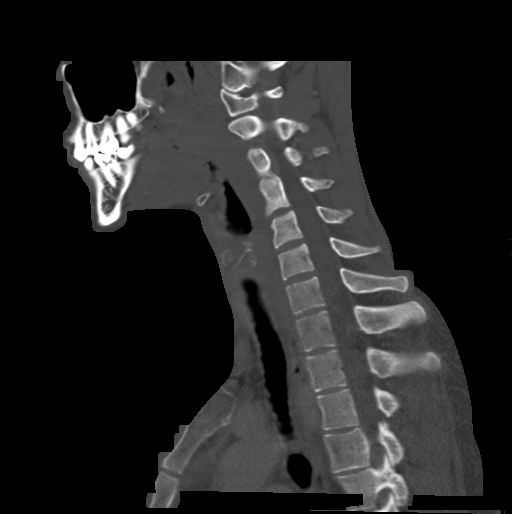
[im 59/101  bone]
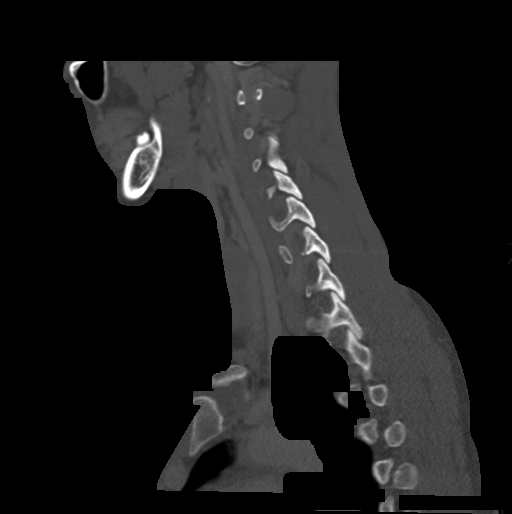
[im 67/101  bone]
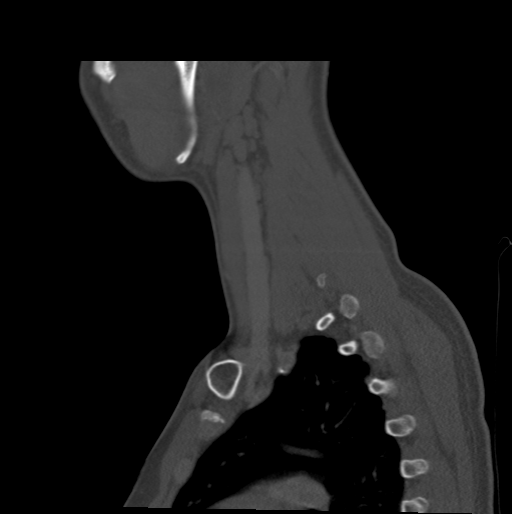

[15 of 33 positions shown; findings below may reference images not displayed]

FINDINGS: Pharynx and larynx: Symmetric prominence of the palatine tonsils.
Right greater than left pharyngeal mucosal/submucosal edema. A small
amount of low-density secretions are present along the anterior
aspect of the right palatine tonsil, but no peritonsillar abscess is
appreciated. Mild effacement of the oropharyngeal airway. No
swelling or discrete mass within the oral cavity, hypopharynx or
larynx. The epiglottis is unremarkable. No retropharyngeal
collection.

Salivary glands: No inflammation, mass, or stone.

Thyroid: Unremarkable.

Lymph nodes: Bilateral cervical lymphadenopathy, likely reactive.
For instance, a right level 2 lymph node measures 3.4 x 2.5 cm
(series 7, image 22).

Vascular: The major vascular structures of the neck are patent.
Aberrant right subclavian artery.

Limited intracranial: No evidence of acute intracranial abnormality
within the field of view.

Visualized orbits: No acute or significant orbital finding.

Mastoids and visualized paranasal sinuses: Trace mucosal thickening
along the floor of the right maxillary sinus. No significant mastoid
effusion.

Skeleton: No acute bony abnormality or aggressive osseous lesion.

Upper chest: No consolidation within the imaged lung apices.
IMPRESSION: Symmetric prominence of the palatine tonsils with right greater than
left mucosal/submucosal pharyngeal edema. These findings are
compatible with acute tonsillitis/pharyngitis in the appropriate
clinical setting. Mild effacement of the oropharyngeal airway. No
appreciable peritonsillar abscess at this time.

Right greater than left cervical lymphadenopathy, as described and
likely reactive.

Minimal mucosal thickening along the floor of the right maxillary
sinus.

Aberrant right subclavian artery (anatomic variant).
